# Patient Record
Sex: Male | Born: 1960 | Race: White | Hispanic: No | Marital: Married
Health system: Southern US, Community
[De-identification: ages and names within clinical notes are randomized; demographics above are authoritative.]

---

## 2017-04-05 ENCOUNTER — Emergency Department (HOSPITAL_BASED_OUTPATIENT_CLINIC_OR_DEPARTMENT_OTHER)
Admission: EM | Admit: 2017-04-05 | Discharge: 2017-04-06 | Disposition: A | Discharge status: Home / Self Care | Payer: Managed Care, Other (non HMO) | Attending: Emergency Medicine | Admitting: Emergency Medicine

## 2017-04-05 DIAGNOSIS — Y999 Unspecified external cause status: Secondary | ICD-10-CM | POA: Diagnosis not present

## 2017-04-05 DIAGNOSIS — Z7901 Long term (current) use of anticoagulants: Secondary | ICD-10-CM | POA: Diagnosis not present

## 2017-04-05 DIAGNOSIS — W228XXA Striking against or struck by other objects, initial encounter: Secondary | ICD-10-CM | POA: Insufficient documentation

## 2017-04-05 DIAGNOSIS — S7011XA Contusion of right thigh, initial encounter: Secondary | ICD-10-CM | POA: Insufficient documentation

## 2017-04-05 DIAGNOSIS — Y929 Unspecified place or not applicable: Secondary | ICD-10-CM | POA: Diagnosis not present

## 2017-04-05 DIAGNOSIS — Z7982 Long term (current) use of aspirin: Secondary | ICD-10-CM | POA: Diagnosis not present

## 2017-04-05 DIAGNOSIS — Y939 Activity, unspecified: Secondary | ICD-10-CM | POA: Insufficient documentation

## 2017-04-05 DIAGNOSIS — M79651 Pain in right thigh: Secondary | ICD-10-CM

## 2017-04-05 DIAGNOSIS — S79921A Unspecified injury of right thigh, initial encounter: Secondary | ICD-10-CM | POA: Diagnosis present
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
## 2017-04-06 ENCOUNTER — Emergency Department (HOSPITAL_BASED_OUTPATIENT_CLINIC_OR_DEPARTMENT_OTHER): Payer: Managed Care, Other (non HMO)

## 2017-04-06 LAB — CBC WITH DIFFERENTIAL/PLATELET
BASOS ABS: 0.1 10*3/uL (ref 0.0–0.1)
Basophils Relative: 1 %
Eosinophils Absolute: 0.2 10*3/uL (ref 0.0–0.7)
Eosinophils Relative: 1 %
HCT: 39.4 % (ref 39.0–52.0)
HEMOGLOBIN: 13.4 g/dL (ref 13.0–17.0)
LYMPHS PCT: 25 %
Lymphs Abs: 3.1 10*3/uL (ref 0.7–4.0)
MCH: 28.2 pg (ref 26.0–34.0)
MCHC: 34 g/dL (ref 30.0–36.0)
MCV: 82.8 fL (ref 78.0–100.0)
Monocytes Absolute: 1.4 10*3/uL — ABNORMAL HIGH (ref 0.1–1.0)
Monocytes Relative: 11 %
NEUTROS ABS: 7.8 10*3/uL — AB (ref 1.7–7.7)
NEUTROS PCT: 62 %
Platelets: 244 10*3/uL (ref 150–400)
RBC: 4.76 MIL/uL (ref 4.22–5.81)
RDW: 13.4 % (ref 11.5–15.5)
WBC: 12.6 10*3/uL — AB (ref 4.0–10.5)

## 2017-04-06 LAB — BASIC METABOLIC PANEL
ANION GAP: 6 (ref 5–15)
BUN: 10 mg/dL (ref 6–20)
CHLORIDE: 107 mmol/L (ref 101–111)
CO2: 25 mmol/L (ref 22–32)
Calcium: 9.4 mg/dL (ref 8.9–10.3)
Creatinine, Ser: 0.79 mg/dL (ref 0.61–1.24)
GFR calc Af Amer: >60 mL/min (ref >60)
GLUCOSE: 102 mg/dL — AB (ref 65–99)
POTASSIUM: 3.9 mmol/L (ref 3.5–5.1)
Sodium: 138 mmol/L (ref 135–145)

## 2017-04-06 LAB — PROTIME-INR
INR: 2.12
PROTHROMBIN TIME: 24.1 s — AB (ref 11.4–15.2)

## 2017-04-06 MED ORDER — IOPAMIDOL (ISOVUE-300) INJECTION 61%: 100.0000 mL | Freq: Once | INTRAVENOUS | Status: DC | PRN

## 2017-04-06 MED ORDER — SODIUM CHLORIDE 0.9 % IV SOLN
Freq: Once | INTRAVENOUS | Status: AC
  Administered 2017-04-06: 125 mL via INTRAVENOUS

## 2017-04-06 MED ORDER — IOPAMIDOL (ISOVUE-M 300) INJECTION 61%: 15.0000 mL | Freq: Once | INTRAMUSCULAR | Status: DC | PRN

## 2017-04-06 MED ORDER — IOPAMIDOL (ISOVUE-370) INJECTION 76%
125.0000 mL | Freq: Once | INTRAVENOUS | Status: AC | PRN
  Administered 2017-04-06: 125 mL via INTRAVENOUS

## 2017-04-06 MED ORDER — HYDROCODONE-ACETAMINOPHEN 5-325 MG PO TABS: 1.0000 | ORAL_TABLET | Freq: Four times a day (QID) | ORAL | 0 refills | Status: AC | PRN

## 2017-04-06 NOTE — ED Notes (Signed)
Returned from CT.
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
